# Patient Record
Sex: Female | Born: 1980 | Race: White | Hispanic: No | Marital: Single | Smoking: Current every day smoker
Health system: Southern US, Community
[De-identification: ages and names within clinical notes are randomized; demographics above are authoritative.]

## PROBLEM LIST (undated history)

## (undated) DIAGNOSIS — C866 Primary cutaneous CD30-positive T-cell proliferations: Secondary | ICD-10-CM

## (undated) DIAGNOSIS — L502 Urticaria due to cold and heat: Secondary | ICD-10-CM

## (undated) DIAGNOSIS — J45909 Unspecified asthma, uncomplicated: Secondary | ICD-10-CM

---

## 2004-03-14 ENCOUNTER — Emergency Department: Payer: Self-pay | Admitting: Emergency Medicine

## 2004-08-05 ENCOUNTER — Emergency Department: Payer: Self-pay | Admitting: Emergency Medicine

## 2004-08-19 ENCOUNTER — Emergency Department: Payer: Self-pay | Admitting: Unknown Physician Specialty

## 2004-10-31 ENCOUNTER — Emergency Department: Payer: Self-pay | Admitting: Emergency Medicine

## 2004-10-31 ENCOUNTER — Other Ambulatory Visit: Payer: Self-pay

## 2005-09-26 ENCOUNTER — Emergency Department: Payer: Self-pay | Admitting: Emergency Medicine

## 2005-10-29 ENCOUNTER — Emergency Department: Payer: Self-pay | Admitting: Emergency Medicine

## 2006-07-23 ENCOUNTER — Emergency Department: Payer: Self-pay | Admitting: Internal Medicine

## 2009-06-25 ENCOUNTER — Emergency Department: Payer: Self-pay | Admitting: Emergency Medicine

## 2010-09-04 ENCOUNTER — Emergency Department: Payer: Self-pay | Admitting: Emergency Medicine

## 2011-03-22 ENCOUNTER — Emergency Department: Payer: Self-pay | Admitting: Emergency Medicine

## 2012-03-03 IMAGING — CT CT HEAD WITHOUT CONTRAST
2 series · 16 of 30 positions shown, 20 images · non-contrast
Comparison: none

REASON FOR EXAM: headache, dizziness
COMMENTS:

[Series 2: without · axial · non-contrast · 0.41mm/px · z∈[+1128,+1253]mm · 13 of 31 slices shown, 17 images]
[im 3/31  brain]
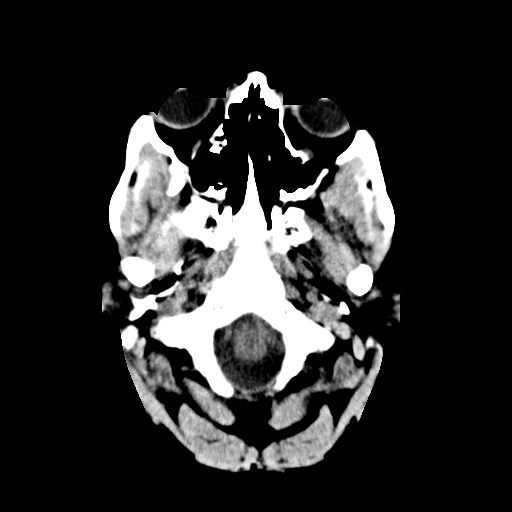
[im 3/31  bone]
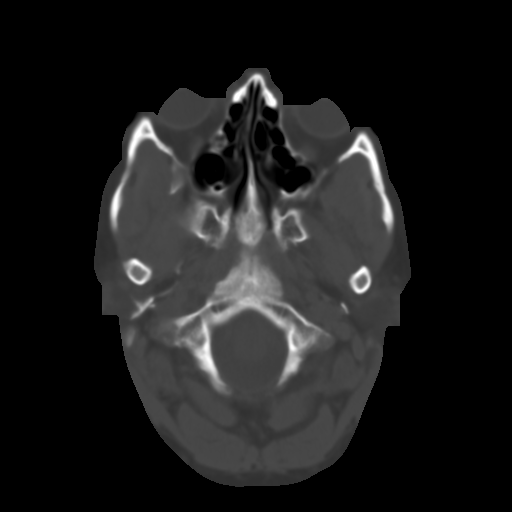
[im 5/31  brain]
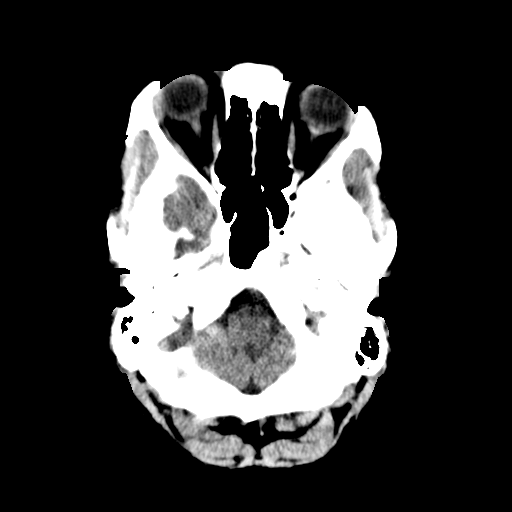
[im 7/31  brain]
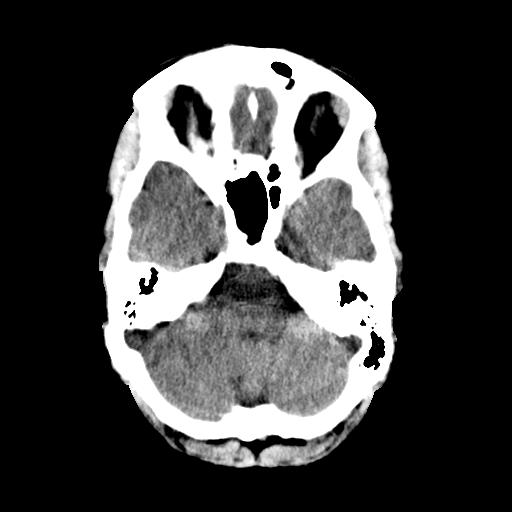
[im 9/31  brain]
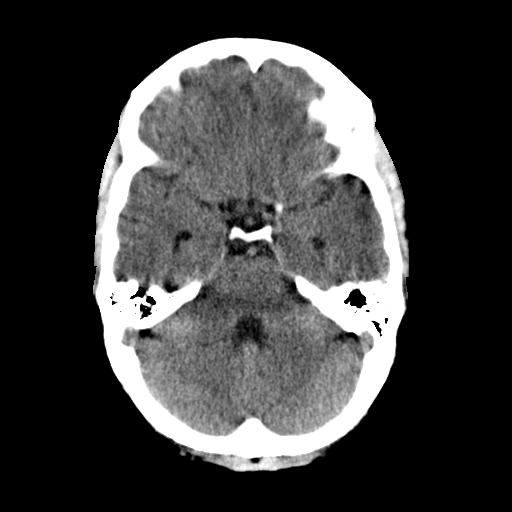
[im 11/31  brain]
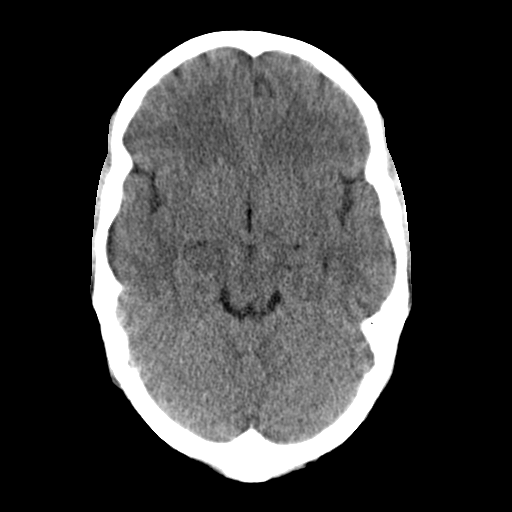
[im 11/31  bone]
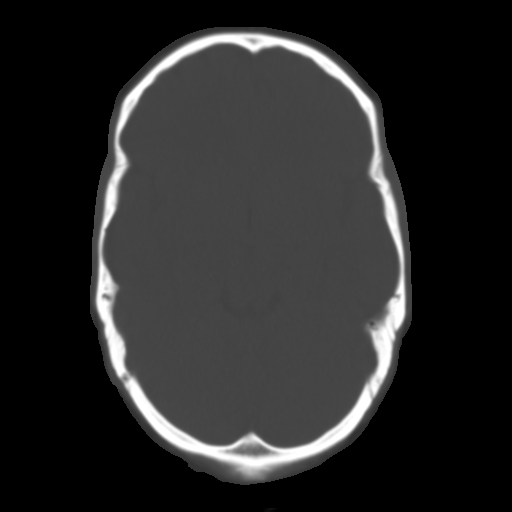
[im 13/31  brain]
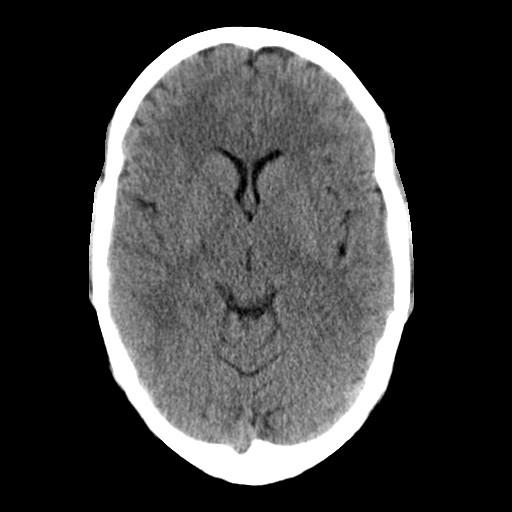
[im 16/31  brain]
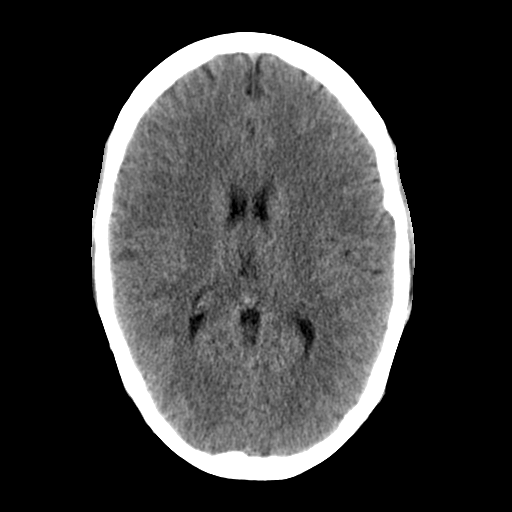
[im 18/31  brain]
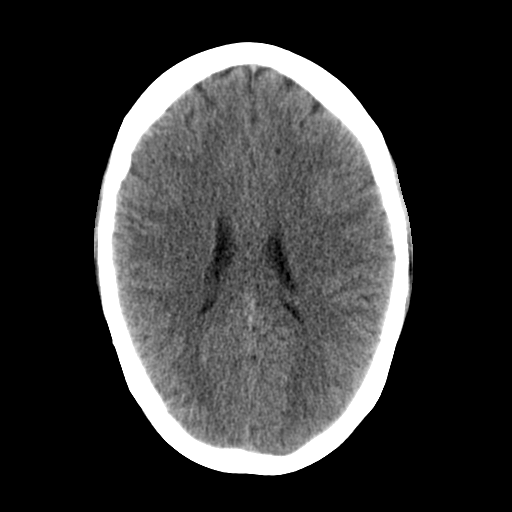
[im 20/31  brain]
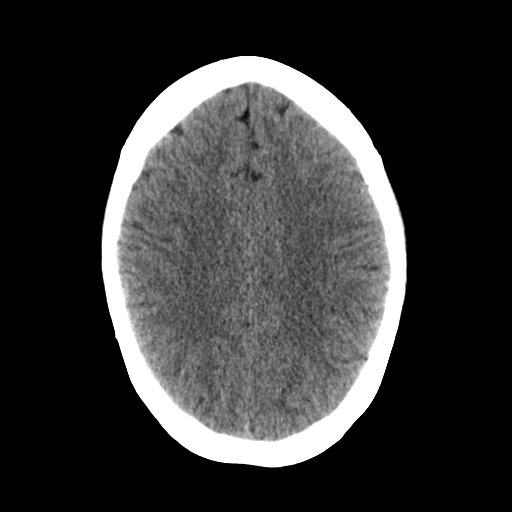
[im 20/31  bone]
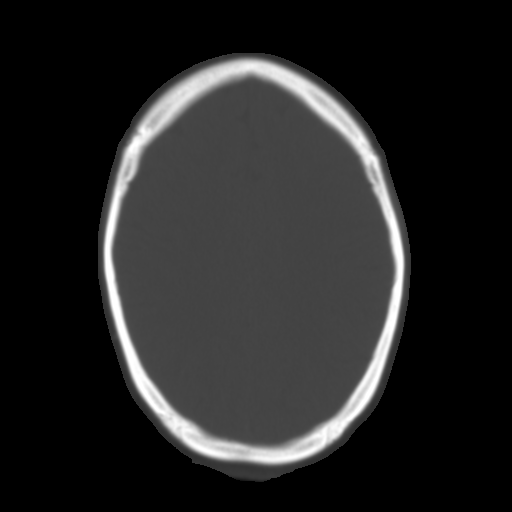
[im 22/31  brain]
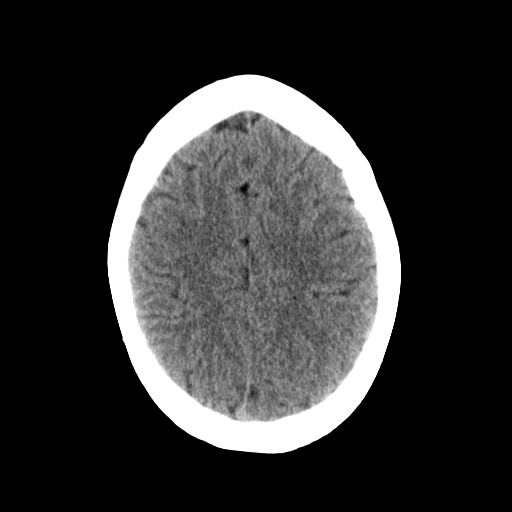
[im 24/31  brain]
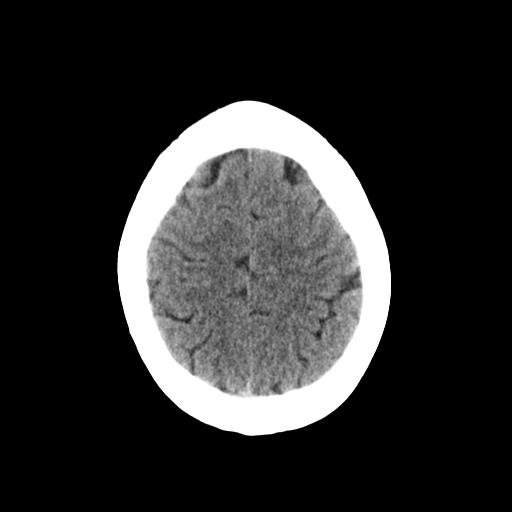
[im 26/31  brain]
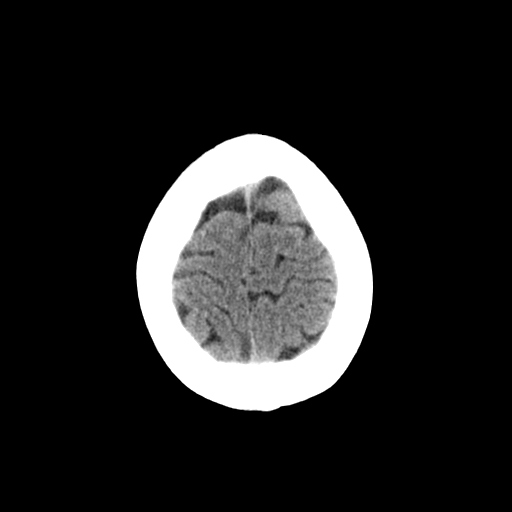
[im 28/31  brain]
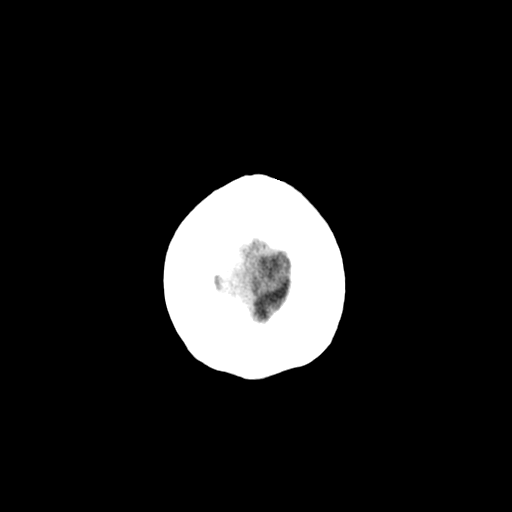
[im 28/31  bone]
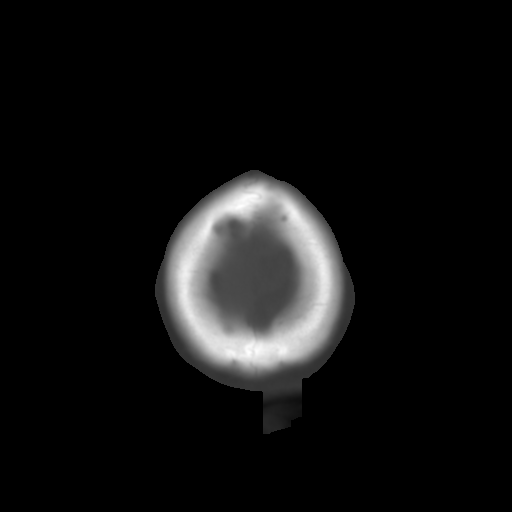

[Series 3: bone · axial · 0.41mm/px · z∈[+1128,+1168]mm · 3 of 31 slices shown]
[im 3/31  bone]
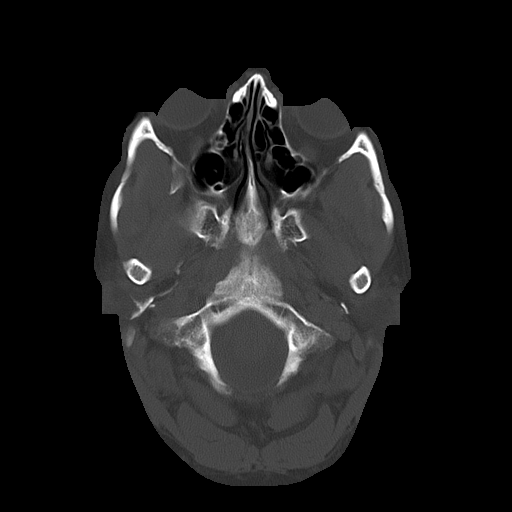
[im 7/31  bone]
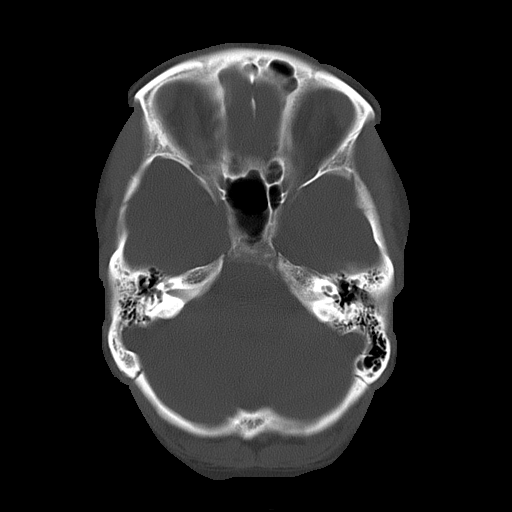
[im 11/31  bone]
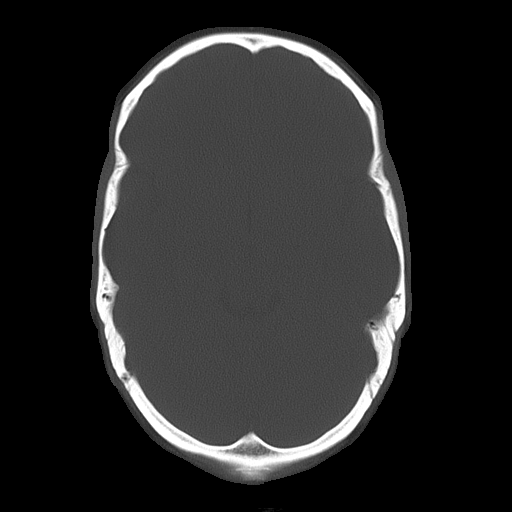

[16 of 30 positions shown; findings below may reference images not displayed]

PROCEDURE:     CT  - CT HEAD WITHOUT CONTRAST  - September 05, 2010  [DATE]

RESULT:     Axial noncontrast CT scanning was performed through the brain at
5 mm intervals and slice thicknesses.

The ventricles are normal in size and position. There is no intracranial
hemorrhage nor intracranial mass effect. There is no evidence of an evolving
ischemic event. The cerebellum and brainstem are normal in density. At bone
window settings I do not see evidence of an acute skull fracture. The
observed portions of the paranasal sinuses are clear.
IMPRESSION: I see no acute intracranial abnormality.

A preliminary report was sent to the [HOSPITAL] the conclusion
of the study.

## 2012-06-06 ENCOUNTER — Ambulatory Visit: Payer: Self-pay | Admitting: Family Medicine

## 2012-10-13 ENCOUNTER — Ambulatory Visit: Payer: Self-pay | Admitting: Internal Medicine

## 2012-10-18 ENCOUNTER — Emergency Department: Payer: Self-pay | Admitting: Emergency Medicine

## 2012-10-18 LAB — BASIC METABOLIC PANEL
BUN: 12 mg/dL (ref 7–18)
Chloride: 105 mmol/L (ref 98–107)
EGFR (African American): >60
Glucose: 120 mg/dL — ABNORMAL HIGH (ref 65–99)
Osmolality: 280 (ref 275–301)
Potassium: 3.1 mmol/L — ABNORMAL LOW (ref 3.5–5.1)
Sodium: 140 mmol/L (ref 136–145)

## 2012-10-18 LAB — CBC
HCT: 34.9 % — ABNORMAL LOW (ref 35.0–47.0)
MCV: 87 fL (ref 80–100)
Platelet: 238 10*3/uL (ref 150–440)
RBC: 4.01 10*6/uL (ref 3.80–5.20)
RDW: 12.6 % (ref 11.5–14.5)
WBC: 14.8 10*3/uL — ABNORMAL HIGH (ref 3.6–11.0)

## 2012-10-18 LAB — MAGNESIUM: Magnesium: 2.2 mg/dL

## 2013-02-18 ENCOUNTER — Ambulatory Visit: Payer: Self-pay | Admitting: Family Medicine

## 2013-02-18 LAB — RAPID INFLUENZA A&B ANTIGENS

## 2013-02-18 LAB — RAPID STREP-A WITH REFLX: MICRO TEXT REPORT: NEGATIVE

## 2013-02-20 LAB — BETA STREP CULTURE(ARMC)

## 2013-05-31 ENCOUNTER — Emergency Department: Payer: Self-pay | Admitting: Emergency Medicine

## 2014-04-16 IMAGING — CR DG CHEST 2V
1 series · 2 of 2 positions shown · non-contrast
Comparison: none

REASON FOR EXAM: short of breath  -  ed waiting room
COMMENTS:   May transport without cardiac monitor

PROCEDURE:     DXR - DXR CHEST PA (OR AP) AND LATERAL  - October 18, 2012  [DATE]
RESULT:     The lungs are clear. The cardiac silhouette and visualized bony
skeleton are unremarkable.

[Series 1: w chest pa · 0.14mm/px · 2 of 2 slices shown]
[im 1/2]
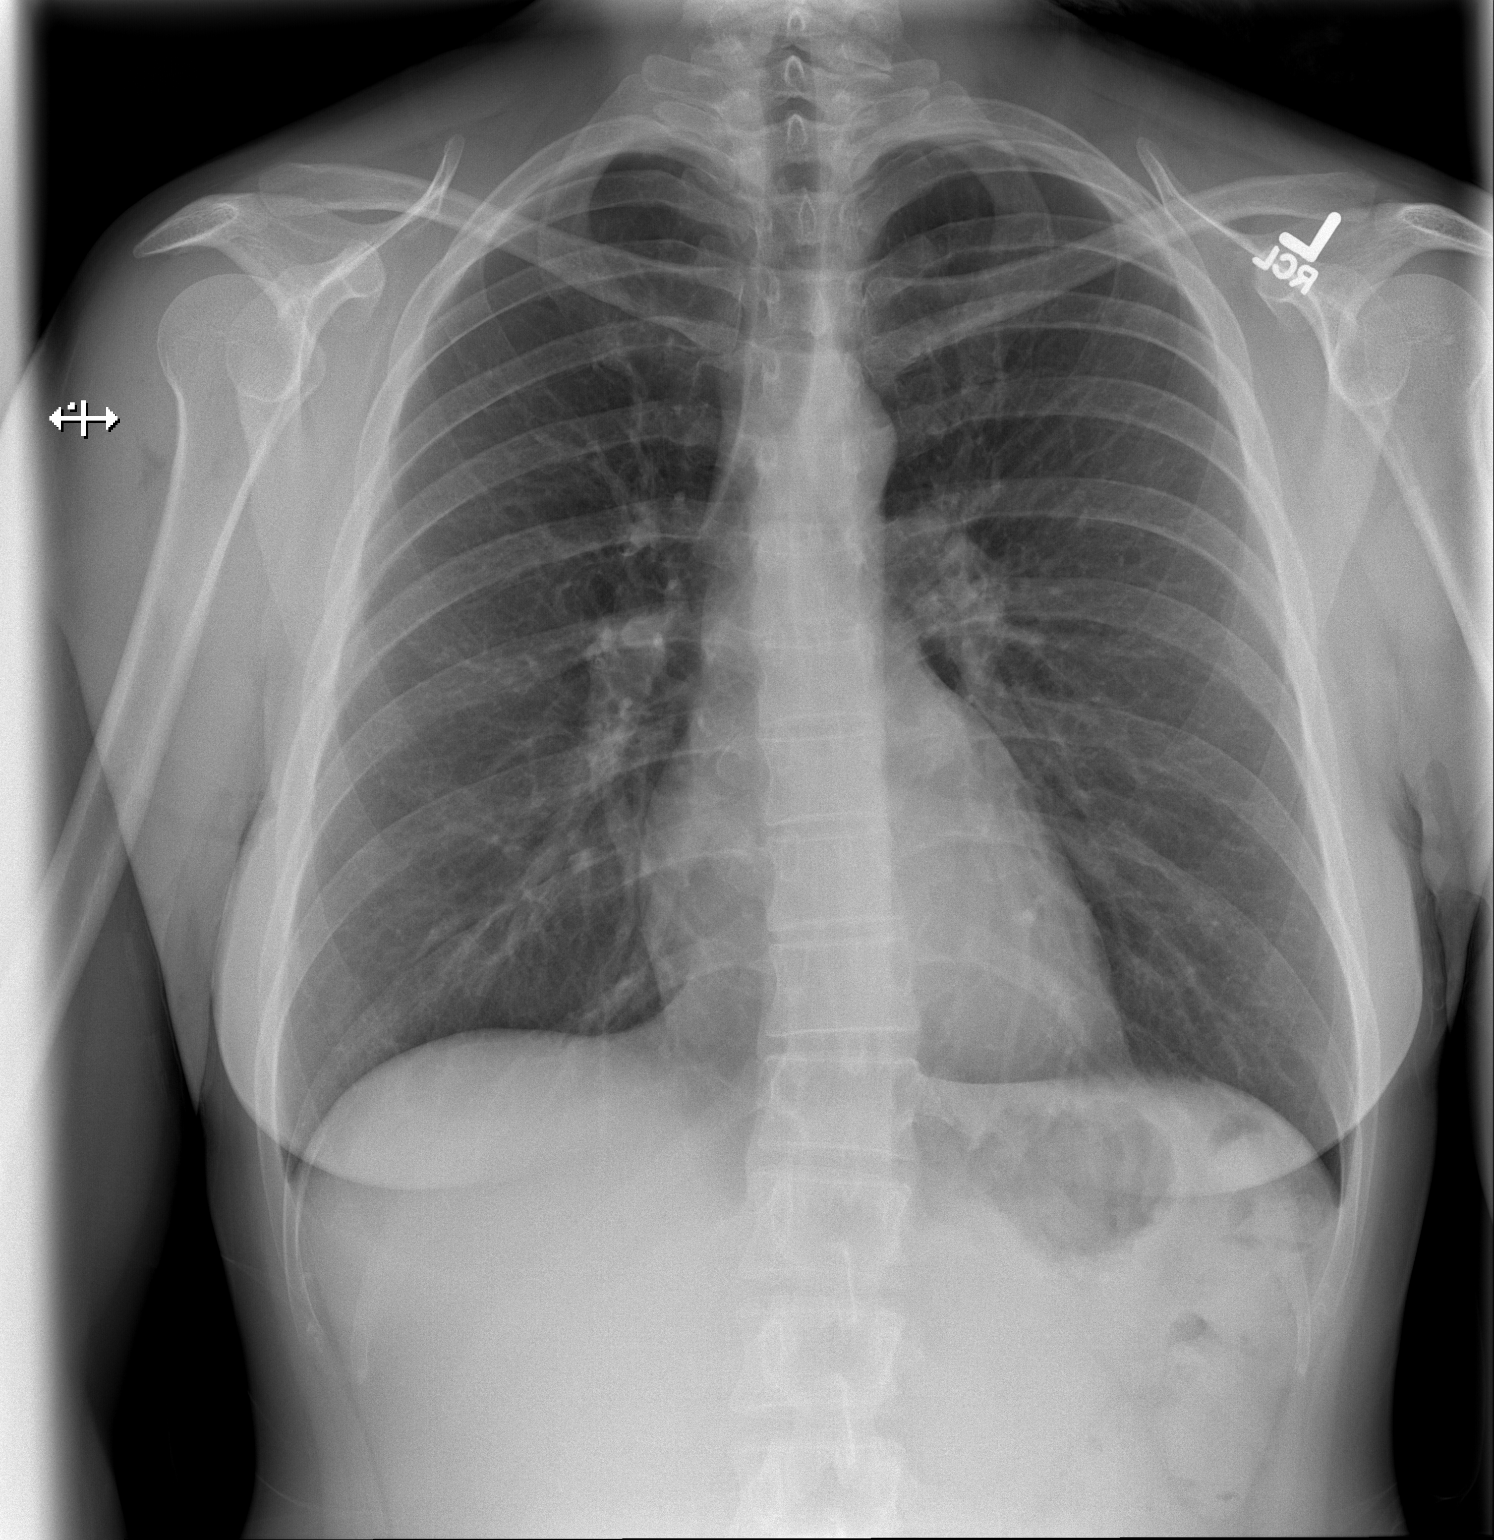
[im 2/2]
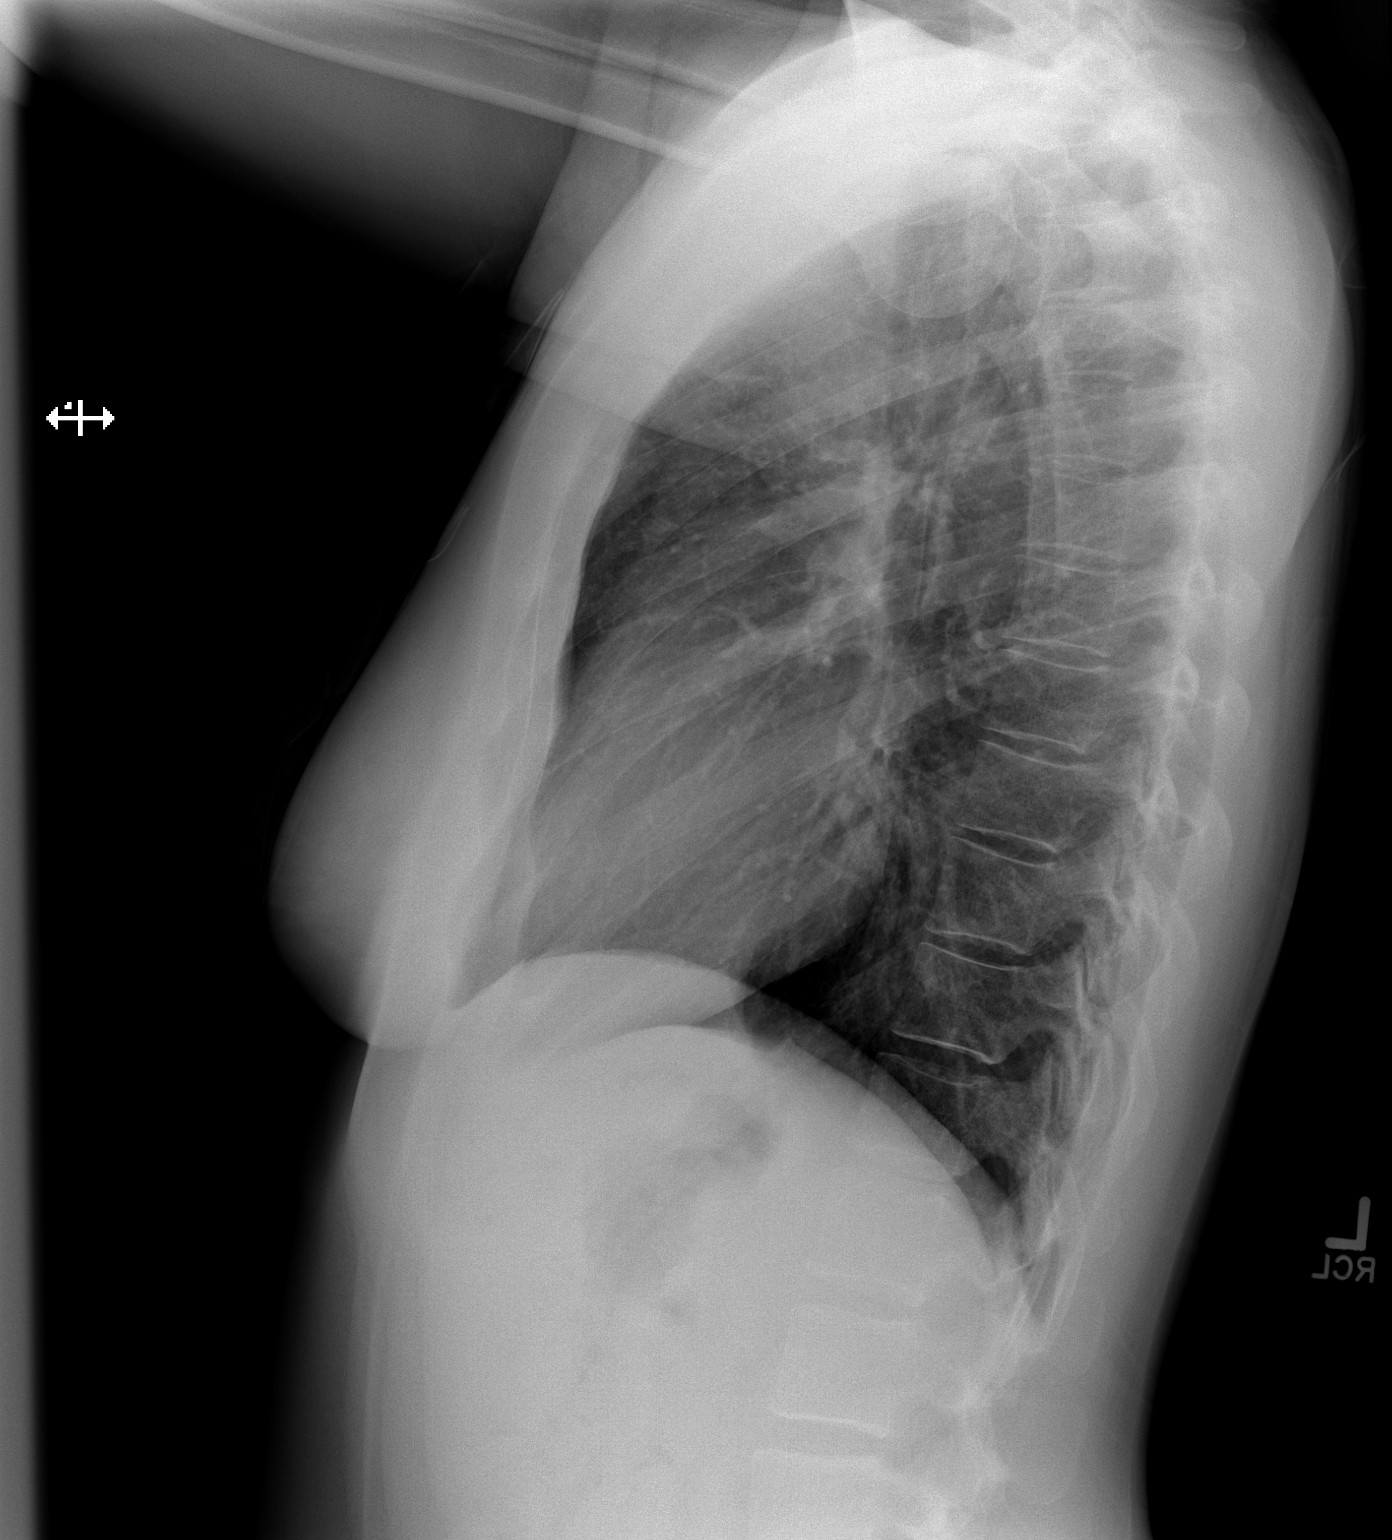

[2 of 2 positions shown; findings below may reference images not displayed]

IMPRESSION: 1. Chest radiograph without evidence of acute cardiopulmonary disease.

## 2014-09-15 ENCOUNTER — Encounter: Payer: Self-pay | Admitting: *Deleted

## 2014-09-15 DIAGNOSIS — T50A95A Adverse effect of other bacterial vaccines, initial encounter: Secondary | ICD-10-CM | POA: Insufficient documentation

## 2014-09-15 DIAGNOSIS — Y9289 Other specified places as the place of occurrence of the external cause: Secondary | ICD-10-CM | POA: Insufficient documentation

## 2014-09-15 DIAGNOSIS — Z720 Tobacco use: Secondary | ICD-10-CM | POA: Insufficient documentation

## 2014-09-15 DIAGNOSIS — S5491XA Injury of unspecified nerve at forearm level, right arm, initial encounter: Secondary | ICD-10-CM | POA: Insufficient documentation

## 2014-09-15 DIAGNOSIS — Y9389 Activity, other specified: Secondary | ICD-10-CM | POA: Insufficient documentation

## 2014-09-15 DIAGNOSIS — Y998 Other external cause status: Secondary | ICD-10-CM | POA: Insufficient documentation

## 2014-09-15 DIAGNOSIS — X58XXXA Exposure to other specified factors, initial encounter: Secondary | ICD-10-CM | POA: Insufficient documentation

## 2014-09-15 NOTE — ED Notes (Addendum)
Pt reports she had PNA and FLU vaccine on Wednesday in the right upper arm. Pt states she did have some redness around the injection side that "is better today", saw her PCP for the same yesterday. Pt c/o tense burning in her right shoulder, elbow and cramping in her hand today.

## 2014-09-16 ENCOUNTER — Emergency Department
Admission: EM | Admit: 2014-09-16 | Discharge: 2014-09-16 | Disposition: A | Discharge status: Home / Self Care | Payer: Self-pay | Attending: Emergency Medicine | Admitting: Emergency Medicine

## 2014-09-16 DIAGNOSIS — S4491XA Injury of unspecified nerve at shoulder and upper arm level, right arm, initial encounter: Secondary | ICD-10-CM

## 2014-09-16 HISTORY — DX: Unspecified asthma, uncomplicated: J45.909

## 2014-09-16 HISTORY — DX: Primary cutaneous CD30-positive T-cell proliferations: C86.6

## 2014-09-16 HISTORY — DX: Urticaria due to cold and heat: L50.2

## 2014-09-16 MED ORDER — PREDNISONE 20 MG PO TABS
30.0000 mg | ORAL_TABLET | Freq: Once | ORAL | Status: AC
  Administered 2014-09-16: 30 mg via ORAL
  Filled 2014-09-16: qty 1

## 2014-09-16 MED ORDER — METHYLPREDNISOLONE 4 MG PO TBPK: ORAL_TABLET | ORAL | Status: AC

## 2014-09-16 NOTE — ED Notes (Signed)
Pt. States She had her PNA and flu vaccine on Wednesday.  Pt. States within a couple hours after getting vaccine pt. States she got redness on upper arm of shot.  Pt. States she went back to PCP the next day and was prescribed naproxen.  Pt. States most of redness has disappeared.  Pt. States today feeling sore and numbness to rt. Arm, rt. Side of face, rt. shoulder and feeling numbness to rt. Fingers.  Pt. States this was her first PNA vaccine, but denies having adverse reaction to flu vaccines.

## 2014-09-16 NOTE — Discharge Instructions (Signed)
1. Take Medrol Dosepak as prescribed. 2. Continue Naproxen as directed by your doctor. 3. Return to the ER for worsening symptoms, increased swelling, weakness in arm or hand, or other concerns.  Neurapraxia Neurapraxia is a temporary loss of nerve function. It does not cause permanent damage to a nerve. If your foot "falls asleep," that is a type of neurapraxia. It will go away as soon as you start moving your foot. A more serious neurapraxia could take up to 6 weeks to go away. CAUSES   Anything that strains a nerve can cause neurapraxia. The nerve might be stretched or twisted. Something might press or pound on it and cause decreased blood flow to the nerve. Causes of neurapraxia can include:  Bones that break (fracture) or move out of place (dislocation). This can cause the nerves to stretch or twist out of their normal position. This happens most often in the arms and shoulders.  Neck strain when the head moves suddenly, such as in a car crash. This is called whiplash (cervical neurapraxia). It can also happen while playing sports. For example, a football injury called a stinger is a type of neurapraxia. It causes severe pain to shoot down an arm.  Stretching the neck too far from the shoulder. This damages the nerves that go into the shoulder, arm, and hand. It causes numbness and muscle weakness. This condition is called brachial plexus neurapraxia.  Repeated or prolonged pressure can cause decreased blood flow to the nerve (ischemic neurapraxia). Such causes of neurapraxia can include:  A cast or bandage that is too tight around an injured arm or leg. This limits blood flow to the nerves and causes a condition called compartment syndrome. This condition develops when pressure builds up around muscles and nerves.  Infection and disease. This can cut off blood flow to the nerve.  Very cold temperatures.  Sleeping in a position that limits blood flow to your leg or arm. SIGNS AND  SYMPTOMS  Numbness and tingling.  Muscle weakness.  Burning pain.  Cool skin. DIAGNOSIS  Neurapraxia is diagnosed through:  A physical exam. This will include asking questions about your health. Your health care provider will ask about any symptoms you are having. Your health care provider may also:  Test how strong your muscles are.  Check feeling (sensation) in various areas of your body. A very light touch or pricks with a pin may be used.  Check whether you have signs of nerve damage on one side of the body or both.  Tests such as:  Electromyography (EMG). This test measures electrical activity in a muscle. It shows whether the nerve that supplies the muscle is working.  Nerve conduction studies (NCS). They measure the flow of electricity through a nerve.  Magnetic resonance imaging (MRI). This is a machine that uses magnets and a computer to create pictures of your nerves. TREATMENT  Treatment aims to ease pain and swelling and to provide support while your body heals.  Medicine may include:  Pain medicine.  Antidepressants.  Seizure medicine.  Medicine to reduce swelling.  For support, options may include:  Braces, walkers, or crutches.  Physical therapy. Having a specialist work with you often speeds healing. It can also help prevent stiffness and future damage.  Surgery. This may be needed if broken or dislocated bones are part of the problem. Surgery also may be done to relieve pressure on nerves or to restore normal blood flow to nerves and muscles.  Electrical stimulators. These  devices send pulses of electricity into the muscles. The aim is to bring back movement. HOME CARE INSTRUCTIONS What you need to do at home will vary. It will depend on your treatment plan and the type of neurapraxia you have. In general:  Take medicine as told by your health care provider. Follow the directions carefully.  Rest. Give your body time to heal.  Use any splints,  braces, or other support devices as directed. If you have questions about their use, ask your health care provider.  Start physical therapy, if that is suggested. Ask if it is okay to practice the exercises at home, too.  If areas of your body are numb, take care to protect them from burns or other injury.  If you had surgery, you will need to care for your surgical cut (incision). Ask for instructions before you leave the hospital.  Keep all follow-up appointments with your health care provider. SEEK MEDICAL CARE IF:   You have any questions about your medicine.  Numbness or muscle weakness continues.  Pain continues, even after taking pain medicine. SEEK IMMEDIATE MEDICAL CARE IF:   Your pain suddenly becomes severe.  Numbness or weakness gets much worse.  Your muscles start to twitch or you have muscle spasms. Document Released: 05/27/2010 Document Revised: 05/09/2013 Document Reviewed: 05/27/2010 [facility] Patient Information 2015 [place], [place]. This information is not intended to replace advice given to you by your health care provider. Make sure you discuss any questions you have with your health care provider.

## 2014-09-16 NOTE — ED Provider Notes (Signed)
[facility] Emergency Department Provider Note  ____________________________________________  Time seen: Approximately 3:16 AM  I have reviewed the triage vital signs and the nursing notes.   HISTORY  Chief Complaint Redness and burning after vaccinations   HPI Kimberly Melton is a 34 y.o. female who presents to the ED from home with a chief complaint of redness and burning to her right arm after receiving vaccinations. Patient states she received a flu and pneumonia vaccine in the right upper arm on Wednesday. She saw her PCP yesterday for redness and swelling around the injection site associated with a waxing/waning burning, prickling sensation in her right neck, shoulder, elbow and hand.She was prescribed naproxen which she states has improved her pain. States redness and swelling significantly improved. Patient concerned for her symptoms of burning and numbness due to things she read on the Internet. Denies weakness in the right arm. Spouse denies slurred speech or altered mentation. Denies recent fever, chills, neck pain, vision changes, slurred speech, falling, chest pain, shortness of breath, abdominal pain, vomiting, diarrhea. Denies recent fall/trauma/injury. Patient is right-hand dominant. It had been a number of years since she received the flu vaccine; however, she is not allergic to eggs. This was the patient's first time to receive the pneumonia vaccine. Patient denies history of immunocompromise, HIV or cancer.   Past Medical History  Diagnosis Date  . Reactive airway disease   . Lymphomatoid papulosis   . Urticaria due to heat   No history of multiple sclerosis  There are no active problems to display for this patient.   Past Surgical History  Procedure Laterality Date  . Cesarean section      Current Outpatient Rx  Name  Route  Sig  Dispense  Refill  . methylPREDNISolone (MEDROL DOSEPAK) 4 MG TBPK tablet      Take as directed   21  tablet   0     Allergies Review of patient's allergies indicates not on file.  Family History No history of multiple sclerosis Mother with seizures  Social History Social History  Substance Use Topics  . Smoking status: Current Every Day Smoker  . Smokeless tobacco: None  . Alcohol Use: No    Review of Systems Constitutional: No fever/chills Eyes: No visual changes. ENT: No sore throat. Cardiovascular: Denies chest pain. Respiratory: Denies shortness of breath. Gastrointestinal: No abdominal pain.  No nausea, no vomiting.  No diarrhea.  No constipation. Genitourinary: Negative for dysuria. Musculoskeletal: Negative for back pain. Skin: Negative for rash. Neurological: Negative for headaches or focal weakness. Positive for burning, tingling and numbness to right arm.  10-point ROS otherwise negative.  ____________________________________________   PHYSICAL EXAM:  VITAL SIGNS: ED Triage Vitals  Enc Vitals Group     BP 09/15/14 2254 122/76 mmHg     Pulse Rate 09/15/14 2254 74     Resp 09/15/14 2254 16     Temp 09/15/14 2254 98.2 F (36.8 C)     Temp Source 09/15/14 2254 Oral     SpO2 09/15/14 2254 98 %     Weight 09/15/14 2254 165 lb (74.844 kg)     Height 09/15/14 2254 5\' 5"  (1.651 m)     Head Cir --      Peak Flow --      Pain Score 09/15/14 2258 6     Pain Loc --      Pain Edu? --      Excl. in [place]? --     Constitutional:  Alert and oriented. Well appearing and in no acute distress. Eyes: Conjunctivae are normal. PERRL. EOMI. Head: Atraumatic. Nose: No congestion/rhinnorhea. Mouth/Throat: Mucous membranes are moist.  Oropharynx non-erythematous. Neck: No stridor. No carotid bruits. No cervical spine tenderness to palpation. Cardiovascular: Normal rate, regular rhythm. Grossly normal heart sounds.  Good peripheral circulation. Respiratory: Normal respiratory effort.  No retractions. Lungs CTAB. Gastrointestinal: Soft and nontender. No distention. No  abdominal bruits. No CVA tenderness. Musculoskeletal:  RUE: Very diminished redness in upper extremity; no warmth/erythema/fluctuance to suggest abscess. 2+ radial pulses. Brisk, less than 5 second capillary refill. Full range of motion without pain. 5/5 motor strength bilaterally. Sensation within normal limits symmetrically. Neurologic:  Normal speech and language. No gross focal neurologic deficits are appreciated. No gait instability. Skin:  Skin is warm, dry and intact. No rash noted. Psychiatric: Mood and affect are normal. Speech and behavior are normal.  ____________________________________________   LABS (all labs ordered are listed, but only abnormal results are displayed)  Labs Reviewed - No data to display ____________________________________________  EKG  None ____________________________________________  RADIOLOGY  None ____________________________________________   PROCEDURES  Procedure(s) performed: None  Critical Care performed: No  ____________________________________________   INITIAL IMPRESSION / ASSESSMENT AND PLAN / ED COURSE  Pertinent labs & imaging results that were available during my care of the patient were reviewed by me and considered in my medical decision making (see chart for details).  34 year old female who presents with symptoms suggestive of neuropraxia s/p receiving 2 immunization injections. There is no evidence of cellulitis or abscess. There is no evidence of CVA. Advised patient to continue NSAIDs; will add a Medrol Dosepak and patient will follow up with her PCP in 3 days. Strict return precautions given. Patient and spouse verbalize understanding and agree with plan of care. ____________________________________________   FINAL CLINICAL IMPRESSION(S) / ED DIAGNOSES  Final diagnoses:  Neuropraxia of right upper extremity, initial encounter      Paulette Blanch, MD 09/16/14 [phone]

## 2014-09-16 NOTE — ED Notes (Signed)
Pt. Going home with husband.
